# Patient Record
Sex: Female | Born: 1975 | Hispanic: Yes | Marital: Married | Smoking: Never smoker
Health system: Southern US, Community
[De-identification: ages and names within clinical notes are randomized; demographics above are authoritative.]

## PROBLEM LIST (undated history)

## (undated) DIAGNOSIS — R300 Dysuria: Secondary | ICD-10-CM

## (undated) DIAGNOSIS — R12 Heartburn: Secondary | ICD-10-CM

## (undated) HISTORY — DX: Dysuria: R30.0

## (undated) HISTORY — PX: CHOLECYSTECTOMY: SHX55

## (undated) HISTORY — DX: Heartburn: R12

## (undated) HISTORY — PX: TUBAL LIGATION: SHX77

---

## 2006-11-18 ENCOUNTER — Ambulatory Visit: Payer: Self-pay | Admitting: Psychiatry

## 2007-10-16 ENCOUNTER — Emergency Department: Payer: Self-pay | Admitting: Emergency Medicine

## 2008-03-20 ENCOUNTER — Ambulatory Visit: Payer: Self-pay | Admitting: Certified Nurse Midwife

## 2008-06-07 ENCOUNTER — Ambulatory Visit: Payer: Self-pay | Admitting: Certified Nurse Midwife

## 2008-07-30 ENCOUNTER — Inpatient Hospital Stay: Payer: Self-pay | Admitting: Obstetrics and Gynecology

## 2008-10-05 ENCOUNTER — Ambulatory Visit: Payer: Self-pay | Admitting: Certified Nurse Midwife

## 2010-08-20 ENCOUNTER — Ambulatory Visit: Payer: Self-pay | Admitting: Gastroenterology

## 2010-08-21 ENCOUNTER — Ambulatory Visit: Payer: Self-pay | Admitting: Gastroenterology

## 2010-09-12 ENCOUNTER — Ambulatory Visit: Payer: Self-pay | Admitting: Surgery

## 2010-09-19 ENCOUNTER — Ambulatory Visit: Payer: Self-pay | Admitting: Surgery

## 2010-09-22 LAB — PATHOLOGY REPORT

## 2012-10-07 IMAGING — NM NUCLEAR MEDICINE HEPATOHBILIARY INCLUDE GB
2 series · 12 of 12 positions shown · non-contrast
Comparison: none

REASON FOR EXAM: abd pain  epigastric dysphagia   elevated alk
phosphatase  nausea
COMMENTS:

[Series 1000: gallbladder dynamic (results) · 4.80mm/px · 6 of 60 frames shown]
[frame 6/60]
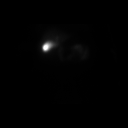
[frame 16/60]
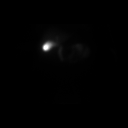
[frame 26/60]
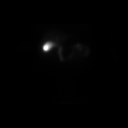
[frame 36/60]
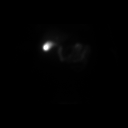
[frame 46/60]
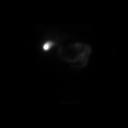
[frame 56/60]
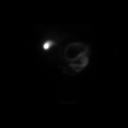

[Series 1000: gallbladder dynamic · 4.80mm/px · 6 of 60 frames shown]
[frame 6/60]
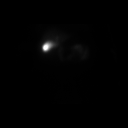
[frame 16/60]
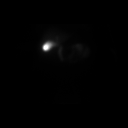
[frame 26/60]
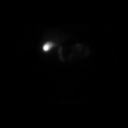
[frame 36/60]
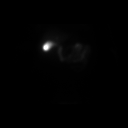
[frame 46/60]
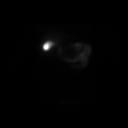
[frame 56/60]
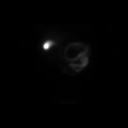

[12 of 12 positions shown; findings below may reference images not displayed]

PROCEDURE:     KNM - KNM HEPATO W/GB EJECT FRACTION  - August 20, 2010 [DATE]

RESULT:     Following intravenous administration of 7.99 mCi technetium 99m
Choletec, there is observed prompt visualization of tracer activity in the
liver at 3 minutes. At 65 minutes, tracer activity is visualized in the
gallbladder, common duct and proximal small bowel.

The gallbladder ejection fraction at 30 minutes following installation of
Sincalide drip measured 29% which is below the normal range for which the
lower limit is 35% at this institution.
IMPRESSION: 1.  Normal Hepatobiliary Scan.
2.  The gallbladder ejection fraction measures 29% which is below the normal
range.

## 2012-11-15 ENCOUNTER — Emergency Department: Payer: Self-pay | Admitting: Emergency Medicine

## 2012-11-15 LAB — URINALYSIS, COMPLETE
Ketone: NEGATIVE
Nitrite: POSITIVE
Ph: 7 (ref 4.5–8.0)
Protein: NEGATIVE
Specific Gravity: 1.001 (ref 1.003–1.030)
Squamous Epithelial: <1
WBC UR: 7 /HPF (ref 0–5)

## 2013-01-29 ENCOUNTER — Emergency Department: Payer: Self-pay | Admitting: Emergency Medicine

## 2013-01-29 LAB — CBC
HCT: 41.7 % (ref 35.0–47.0)
HGB: 13.9 g/dL (ref 12.0–16.0)
MCH: 27.4 pg (ref 26.0–34.0)
MCHC: 33.3 g/dL (ref 32.0–36.0)
Platelet: 219 10*3/uL (ref 150–440)
RDW: 13.4 % (ref 11.5–14.5)

## 2013-01-29 LAB — BASIC METABOLIC PANEL
Calcium, Total: 8.7 mg/dL (ref 8.5–10.1)
Chloride: 105 mmol/L (ref 98–107)
Co2: 29 mmol/L (ref 21–32)
Creatinine: 0.79 mg/dL (ref 0.60–1.30)
EGFR (African American): >60
EGFR (Non-African Amer.): >60
Glucose: 107 mg/dL — ABNORMAL HIGH (ref 65–99)
Potassium: 3.5 mmol/L (ref 3.5–5.1)

## 2013-01-29 LAB — URINALYSIS, COMPLETE
Bilirubin,UR: NEGATIVE
Glucose,UR: NEGATIVE mg/dL (ref 0–75)
Ketone: NEGATIVE
Nitrite: POSITIVE
Ph: 7 (ref 4.5–8.0)
Specific Gravity: 1.001 (ref 1.003–1.030)
WBC UR: 30 /HPF (ref 0–5)

## 2013-01-31 LAB — URINE CULTURE

## 2013-02-10 ENCOUNTER — Ambulatory Visit: Payer: Self-pay | Admitting: Family Medicine

## 2013-04-18 DIAGNOSIS — N390 Urinary tract infection, site not specified: Secondary | ICD-10-CM | POA: Insufficient documentation

## 2015-12-03 ENCOUNTER — Ambulatory Visit (INDEPENDENT_AMBULATORY_CARE_PROVIDER_SITE_OTHER): Payer: Managed Care, Other (non HMO) | Admitting: Urology

## 2015-12-03 ENCOUNTER — Encounter: Payer: Self-pay | Admitting: Urology

## 2015-12-03 VITALS — BP 149/85 | HR 78 | Ht 62.0 in | Wt 135.0 lb

## 2015-12-03 DIAGNOSIS — R109 Unspecified abdominal pain: Secondary | ICD-10-CM | POA: Diagnosis not present

## 2015-12-03 DIAGNOSIS — R300 Dysuria: Secondary | ICD-10-CM | POA: Diagnosis not present

## 2015-12-03 DIAGNOSIS — R32 Unspecified urinary incontinence: Secondary | ICD-10-CM

## 2015-12-03 LAB — URINALYSIS, COMPLETE
Bilirubin, UA: NEGATIVE
Glucose, UA: NEGATIVE
Ketones, UA: NEGATIVE
LEUKOCYTES UA: NEGATIVE
Nitrite, UA: NEGATIVE
PH UA: 5.5 (ref 5.0–7.5)
PROTEIN UA: NEGATIVE
Urobilinogen, Ur: 0.2 mg/dL (ref 0.2–1.0)

## 2015-12-03 LAB — BLADDER SCAN AMB NON-IMAGING: SCAN RESULT: 86

## 2015-12-03 LAB — MICROSCOPIC EXAMINATION

## 2015-12-03 NOTE — Progress Notes (Signed)
12/03/2015 10:40 AM   [place] Nov 23, 1975 [id number]  Referring provider: No referring provider defined for this encounter.  Chief Complaint  Patient presents with  . Dysuria    referred by [facility]    HPI: Patient is a 40 year old Hispanic female who is referred to us by the [facility] for [facility].  She presents with interpreter, Caesar.  He shouldn't states that over the last 20 years she has been experiencing pain after urination and stress urinary incontinence. She states when she holds her urine for too long it becomes painful.  She denies any gross hematuria. UA is unremarkable. Her PVR is 86 mL.  She describes a procedure that she underwent 16 years ago which sounds like a hydro-distention with a physician in [place].  She does not recollect the outcome of that procedure. More recently, she was evaluated by [facility] Raoul [facility]Borawski [facility] in [place].  She underwent a CT scan of the abdomen and pelvis without contrast. This was performed on 03/14/2013. No nephrolithiasis or ureterolithiasis was seen. No evidence of hydronephrosis.  She also states that they looked inside her bladder. She does not recall the findings of that evaluation.  She has been given antibiotics for urinary tract infections, but I do not see any documented urinary tract infections.  She states that she has intermittent flank pain.  It is not intense. It does not localize to one side.  It does not radiate.   PMH: Past Medical History  Diagnosis Date  . Dysuria   . Heartburn     Surgical History: Past Surgical History  Procedure Laterality Date  . Tubal ligation    . Cholecystectomy      Home Medications:    Medication List       This list is accurate as of: 12/03/15 10:40 AM.  Always use your most recent med list.               sulfamethoxazole-trimethoprim 800-160 MG tablet  Commonly known as:  BACTRIM DS,SEPTRA DS  Reported on 12/03/2015        Allergies: No  Known Allergies  Family History: Family History  Problem Relation Age of Onset  . Kidney disease Neg Hx   . Bladder Cancer Neg Hx     Social History:  reports that she has never smoked. She does not have any smokeless tobacco history on file. She reports that she does not drink alcohol or use illicit drugs.  ROS: UROLOGY Frequent Urination?: No Hard to postpone urination?: No Burning/pain with urination?: Yes Get up at night to urinate?: No Leakage of urine?: Yes Urine stream starts and stops?: No Trouble starting stream?: No Do you have to strain to urinate?: No Blood in urine?: No Urinary tract infection?: No Sexually transmitted disease?: No Injury to kidneys or bladder?: No Painful intercourse?: No Weak stream?: No Currently pregnant?: No Vaginal bleeding?: No Last menstrual period?: n  Gastrointestinal Nausea?: No Vomiting?: No Indigestion/heartburn?: No Diarrhea?: No Constipation?: No  Constitutional Fever: No Night sweats?: No Weight loss?: Yes Fatigue?: No  Skin Skin rash/lesions?: No Itching?: No  Eyes Blurred vision?: No Double vision?: No  Ears/Nose/Throat Sore throat?: No Sinus problems?: No  Hematologic/Lymphatic Swollen glands?: No Easy bruising?: No  Cardiovascular Leg swelling?: No Chest pain?: No  Respiratory Cough?: No Shortness of breath?: No  Endocrine Excessive thirst?: No  Musculoskeletal Back pain?: No Joint pain?: Yes  Neurological Headaches?: No Dizziness?: No  Psychologic Depression?: No Anxiety?:  No  Physical Exam: BP 149/85 mmHg  Pulse 78  Ht 5\' 2"  (1.575 m)  Wt 135 lb (61.236 kg)  BMI 24.69 kg/m2  LMP 11/28/2015  Constitutional: Well nourished. Alert and oriented, No acute distress. HEENT: [place] AT, moist mucus membranes. Trachea midline, no masses. Cardiovascular: No clubbing, cyanosis, or edema. Respiratory: Normal respiratory effort, no increased work of breathing. GI: Abdomen is soft, non  tender, non distended, no abdominal masses. Liver and spleen not palpable.  No hernias appreciated.  Stool sample for occult testing is not indicated.   GU: No CVA tenderness.  No bladder fullness or masses.  Normal external genitalia, normal pubic hair distribution, no lesions.  Normal urethral meatus, no lesions, no prolapse, no discharge.   No urethral masses, tenderness and/or tenderness. No bladder fullness, tenderness or masses. Normal vagina mucosa, good estrogen effect, no discharge, no lesions, good pelvic support, Grade II cystocele is noted.  Rectocele is not noted.  No cervical motion tenderness.  Uterus is freely mobile and non-fixed.  No adnexal/parametria masses or tenderness noted.  Anus and perineum are without rashes or lesions.    Skin: No rashes, bruises or suspicious lesions. Lymph: No cervical or inguinal adenopathy. Neurologic: Grossly intact, no focal deficits, moving all 4 extremities. Psychiatric: Normal mood and affect.  Laboratory Data: Lab Results  Component Value Date   WBC 8.5 01/29/2013   HGB 13.9 01/29/2013   HCT 41.7 01/29/2013   MCV 82 01/29/2013   PLT 219 01/29/2013    Lab Results  Component Value Date   CREATININE 0.79 01/29/2013     Urinalysis Results for orders placed or performed in visit on 12/03/15  CULTURE, URINE COMPREHENSIVE  Result Value Ref Range   Urine Culture, Comprehensive Final report    Result 1 Lactobacillus species    Result 2 Comment   Microscopic Examination  Result Value Ref Range   WBC, UA 0-5 0 -  5 /hpf   RBC, UA 0-2 0 -  2 /hpf   Epithelial Cells (non renal) 0-10 0 - 10 /hpf   Bacteria, UA Few (A) None seen/Few  Urinalysis, Complete  Result Value Ref Range   Specific Gravity, UA <1.005 (L) 1.005 - 1.030   pH, UA 5.5 5.0 - 7.5   Color, UA Yellow Yellow   Appearance Ur Clear Clear   Leukocytes, UA Negative Negative   Protein, UA Negative Negative/Trace   Glucose, UA Negative Negative   Ketones, UA Negative  Negative   RBC, UA Trace (A) Negative   Bilirubin, UA Negative Negative   Urobilinogen, Ur 0.2 0.2 - 1.0 mg/dL   Nitrite, UA Negative Negative   Microscopic Examination See below:   BLADDER SCAN AMB NON-IMAGING  Result Value Ref Range   Scan Result 86     Pertinent Imaging: Results for TORIA, MONTE (MRN [id number]) as of 12/03/2015 10:32  Ref. Range 12/03/2015 10:22  Scan Result Unknown 86   CT Abdomen Pelvis Wo Contrast - Final result (03/14/2013 9:46 AM) CT Abdomen Pelvis Wo Contrast - Final result (03/14/2013 9:46 AM)  Narrative  [id number] UN 03/14/1408:[id number] [facility]) : CT ABDOMEN PELVIS WO CONTRAST     EXAM: Computed tomography, abdomen and pelvis without contrast material.    DICTATED: 03/14/13 09:49:07    CLINICAL INDICATION: 40 year old F.599.0 - evaluation for renal calculi.    COMPARISON: None     TECHNIQUE:Contiguous axial 5-mm images from the lung bases through the proximal femora were obtained without the administration of oral or  intravenous contrast agents.  For all [facility] CT exams, radiation dose reduction device (automated exposure control) is used or manual techniques with radiation dose As Low As Reasonably Achievable (ALARA) protocol are followed using age and patient-size-specific scan parameters, while   maintaining the necessary diagnostic image quality.    FINDINGS:   The visualized lungs appear unremarkable.    Evaluation of the abdominal viscera is limited due to the lack of intravenous contrast.  The gallbladder is surgically absent. The liver, pancreas, spleen and adrenal glands appear unremarkable.    No nephrolithiasis is identified in either kidney. There is no hydronephrosis, hydroureter, or stones seen along the course of either ureter.No perinephric stranding is identified about either kidney.    There is trace free fluid within the pelvis.The bladder is distended.  There is no evidence of bowel  obstruction.The appendix is normal.    No pathologically enlarged abdominal or pelvic lymph nodes are seen.    The visualized osseous structures are grossly unremarkable.    INTERPRETATION LOCATION:[facility]    IMPRESSION:  No nephrolithiasis or ureterolithiasis seen. No evidence of hydronephrosis.     Assessment & Plan:    1. Dysuria:   Patient describes a possible interstitial cystitis scenario.  This is a diagnosis of exclusion, so we will need to pursue testing to rule out other etiologies.  At this time, she will be undergoing a CT renal stone study to evaluate for possible nephrolithiasis causing her symptoms.  Her UA is unremarkable today, but I will send it for culture for complete workup.  We'll also request the records from Dr. [name] office  - Urinalysis, Complete - CULTURE, URINE COMPREHENSIVE  2. Incontinence:   Patient is describing a mild stress urinary incontinence. I will initiate her on Vesicare 5 mg daily.   I have advised her of the side effects of Vesicare, such as: Dry eyes, dry mouth, constipation, mental confusion and/or urinary retention.  She will be returning for her CT scan results and we will evaluate her symptoms at that time.  - BLADDER SCAN AMB NON-IMAGING  3. Flank pain:   Patient will be undergoing a CT renal stone study in the future for further evaluation. She will return for report. She will contact the office with flank pain becomes intractable or she develops gross hematuria.  Return for CT Renal stone report.  These notes generated with voice recognition software. I apologize for typographical errors.  Michiel Cowboy, PA-C  [facility] [address] [place], [place] [address] [phone]

## 2015-12-06 LAB — CULTURE, URINE COMPREHENSIVE

## 2015-12-08 ENCOUNTER — Telehealth: Payer: Self-pay | Admitting: Urology

## 2015-12-08 DIAGNOSIS — R109 Unspecified abdominal pain: Secondary | ICD-10-CM | POA: Insufficient documentation

## 2015-12-08 DIAGNOSIS — R300 Dysuria: Secondary | ICD-10-CM | POA: Insufficient documentation

## 2015-12-08 DIAGNOSIS — R32 Unspecified urinary incontinence: Secondary | ICD-10-CM | POA: Insufficient documentation

## 2015-12-08 NOTE — Telephone Encounter (Signed)
Which use and my note to the [facility]?

## 2015-12-10 NOTE — Telephone Encounter (Signed)
Would you send my notes?

## 2015-12-10 NOTE — Telephone Encounter (Signed)
What am I supposed to do with the notes?

## 2015-12-11 ENCOUNTER — Telehealth: Payer: Self-pay | Admitting: Urology

## 2015-12-11 NOTE — Telephone Encounter (Signed)
I sent your notes to her PCP  [place]

## 2015-12-17 ENCOUNTER — Ambulatory Visit: Payer: PRIVATE HEALTH INSURANCE | Admitting: Urology

## 2015-12-20 ENCOUNTER — Ambulatory Visit
Admission: RE | Admit: 2015-12-20 | Discharge: 2015-12-20 | Disposition: A | Discharge status: Home / Self Care | Payer: Managed Care, Other (non HMO) | Source: Ambulatory Visit | Attending: Urology | Admitting: Urology

## 2015-12-20 DIAGNOSIS — R109 Unspecified abdominal pain: Secondary | ICD-10-CM | POA: Insufficient documentation

## 2015-12-25 ENCOUNTER — Encounter: Payer: Self-pay | Admitting: Urology

## 2015-12-25 ENCOUNTER — Ambulatory Visit (INDEPENDENT_AMBULATORY_CARE_PROVIDER_SITE_OTHER): Payer: Managed Care, Other (non HMO) | Admitting: Urology

## 2015-12-25 VITALS — BP 145/81 | HR 75 | Ht 61.0 in | Wt 137.0 lb

## 2015-12-25 DIAGNOSIS — R300 Dysuria: Secondary | ICD-10-CM

## 2015-12-25 DIAGNOSIS — R109 Unspecified abdominal pain: Secondary | ICD-10-CM

## 2015-12-25 DIAGNOSIS — R32 Unspecified urinary incontinence: Secondary | ICD-10-CM

## 2015-12-25 LAB — BLADDER SCAN AMB NON-IMAGING: Scan Result: 120

## 2015-12-25 NOTE — Progress Notes (Signed)
10:21 AM   Pat[place] 10-21-1975 [id number]  Referring provider: [facility] [address] [place], [place] [address]  Chief Complaint  Patient presents with  . Follow-up    CT scan results     HPI: Patient is a 40-year-old Hispanic female who presents today with interpreter, [place], to discuss CT renal stone study results.  Background history Patient was referred to [place] by the [place] [facility] for dysuria.  She stated that over the last 20 years she has been experiencing pain after urination and stress urinary incontinence.  She states when she holds her urine for too long it becomes painful.  She denies any gross hematuria. UA was unremarkable. Her PVR was 86 mL.  She describes a procedure that she underwent 16 years ago which sounds like a hydro-distention with a physician in [place].  She does not recollect the outcome of that procedure. More recently, she was evaluated by Dr. Raoul [place] [facility] in [place].  She underwent a CT scan of the abdomen and pelvis without contrast. This was performed on 03/14/2013. No nephrolithiasis or ureterolithiasis was seen. No evidence of hydronephrosis.  She also states that they looked inside her bladder. She does not recall the findings of that evaluation.  She has been given antibiotics for urinary tract infections, but I do not see any documented urinary tract infections.  She states that she has intermittent flank pain.  It is not intense. It does not localize to one side.  It does not radiate.  Today, she still complains of painful urination.  She did complete the Vesicare samples I have given her, but she did not engage in any exercise.  She only has the complaint of mild stress urinary incontinence.  She is also still experiencing the bilateral flank pain. She has not had any gross hematuria.  Her PVR today's visit is 120 mL.  CT renal stone study completed on 12/20/2015 noted no acute findings  identified within the abdomen or pelvis. No explanation for flank pain or urinary symptoms.  I personally reviewed the CT films with the patient and interpreter.  Her urine culture from last visit was negative.  Records received from [place] office indicate that patient has been treated with antibiotics and told that she's had urinary tract infections without positive urine cultures.  This is very frustrating to the patient as there has been no other etiology found for her dysuria.  It has also caused her to be skeptical about what she has been told.   PMH: Past Medical History  Diagnosis Date  . Dysuria   . Heartburn     Surgical History: Past Surgical History  Procedure Laterality Date  . Tubal ligation    . Cholecystectomy      Home Medications:    Medication List    Notice  As of 12/25/2015 10:21 AM   You have not been prescribed any medications.      Allergies: No Known Allergies  Family History: Family History  Problem Relation Age of Onset  . Kidney disease Neg Hx   . Bladder Cancer Neg Hx     Social History:  reports that she has never smoked. She does not have any smokeless tobacco history on file. She reports that she does not drink alcohol or use illicit drugs.  ROS: UROLOGY Frequent Urination?: No Hard to postpone urination?: No Burning/pain with urination?: Yes Get [place] night to urinate?: No Leakage  of urine?: No Urine stream starts and stops?: No Trouble starting stream?: No Do you have to strain to urinate?: No Blood in urine?: No Urinary tract infection?: No Sexually transmitted disease?: No Injury to kidneys or bladder?: No Painful intercourse?: Yes Weak stream?: No Currently pregnant?: No Vaginal bleeding?: No Last menstrual period?: No  Gastrointestinal Nausea?: No Vomiting?: No Indigestion/heartburn?: Yes Diarrhea?: No Constipation?: No  Constitutional Fever: No Night sweats?: No Weight loss?: No Fatigue?:  No  Skin Skin rash/lesions?: No Itching?: No  Eyes Blurred vision?: No Double vision?: No  Ears/Nose/Throat Sore throat?: No Sinus problems?: No  Hematologic/Lymphatic Swollen glands?: No Easy bruising?: Yes  Cardiovascular Leg swelling?: No Chest pain?: No  Respiratory Cough?: No Shortness of breath?: No  Endocrine Excessive thirst?: No  Musculoskeletal Back pain?: Yes Joint pain?: No  Neurological Headaches?: Yes Dizziness?: No  Psychologic Depression?: No Anxiety?: No  Physical Exam: BP 145/81 mmHg  Pulse 75  Ht 5\' 1"  (1.549 m)  Wt 137 lb (62.143 kg)  BMI 25.90 kg/m2  LMP 11/28/2015  Constitutional: Well nourished. Alert and oriented, No acute distress. HEENT: [place], moist mucus membranes. Trachea midline, no masses. Cardiovascular: No clubbing, cyanosis, or edema. Respiratory: Normal respiratory effort, no increased work of breathing. GI: Abdomen is soft, non tender, non distended, no abdominal masses.  GU: No CVA tenderness.  No bladder fullness or masses.   Skin: No rashes, bruises or suspicious lesions. Lymph: No cervical or inguinal adenopathy. Neurologic: Grossly intact, no focal deficits, moving all 4 extremities. Psychiatric: Normal mood and affect.  Laboratory Data: Lab Results  Component Value Date   WBC 8.5 01/29/2013   HGB 13.9 01/29/2013   HCT 41.7 01/29/2013   MCV 82 01/29/2013   PLT 219 01/29/2013    Lab Results  Component Value Date   CREATININE 0.79 01/29/2013    Pertinent Imaging: Results for [place] (MRN [id number]) as of 12/29/2015 13:21  Ref. Range 12/25/2015 09:57  Scan Result Unknown 120    CLINICAL DATA: Cholecystectomy. Complains of left flank pain for 1 year. Difficulty with urination.  EXAM: CT ABDOMEN AND PELVIS WITHOUT CONTRAST  TECHNIQUE: Multidetector CT imaging of the abdomen and pelvis was performed following the standard protocol without IV contrast.  COMPARISON:  None.  FINDINGS: Lower chest: The lung bases are clear. No pleural or pericardial effusion noted.  Hepatobiliary: No mass visualized on this un-enhanced exam. Previous cholecystectomy.  Pancreas: No mass or inflammatory process identified on this un-enhanced exam.  Spleen: Within normal limits in size.  Adrenals/Urinary Tract: The adrenal glands appear normal. No kidney stones or hydronephrosis identified. No ureteral lithiasis. The urinary bladder appears normal.  Stomach/Bowel: The stomach is within normal limits. The small bowel loops have a normal course and caliber. No obstruction. Normal appearance of the colon.  Vascular/Lymphatic: Normal appearance of the abdominal aorta. No enlarged retroperitoneal or mesenteric adenopathy. No enlarged pelvic or inguinal lymph nodes.  Reproductive: The uterus and the adnexal structures are unremarkable.  Other: There is no ascites or focal fluid collections within the abdomen or pelvis.  Musculoskeletal: No suspicious bone lesions identified.  IMPRESSION: 1. No acute findings identified within the abdomen or pelvis. 2. No explanation for left flank pain an urinary symptoms.   Electronically Signed  By: Signa Kell M.D.  On: 12/20/2015 10:18              Assessment & Plan:    1. Dysuria:   Patient describes a possible interstitial cystitis scenario.  This is a diagnosis of exclusion, so we will need to pursue testing to rule out other etiologies.   Urine culture was negative from her last visit.  CT renal study did not demonstrate any etiology for her urinary symptoms.  I've recommended a cystoscopy. I described how this is performed, typically in an office setting with a flexible cystoscope. I described the risks, benefits, and possible side effects, the most common of which is a minor amount of blood in the urine and/or burning which usually resolves in 24 to 48 hours. The patient had the  opportunity to ask questions which were answered. Based upon this discussion, the patient is willing to proceed.   2. Incontinence:   Patient is describing a mild stress urinary incontinence. I will initiate her on Vesicare 5 mg daily.   I have advised her of the side effects of Vesicare, such as: Dry eyes, dry mouth, constipation, mental confusion and/or urinary retention.  Patient did not engage in any activities that caused her incontinence, so it is unclear if the Vesicare 5 mg samples were effective.   We will readdress after she has undergone cystoscopic examination.  3. Flank pain:   CT renal stone study did not find an etiology for her flank pain.     Return for cystoscopy for dysuria and negative urine cultures.  These notes generated with voice recognition software. I apologize for typographical errors.  Michiel CowboySHANNON Jamee Pacholski, PA-C  [facility] [address], [address] [place], [place] [address] [phone]

## 2015-12-29 ENCOUNTER — Telehealth: Payer: Self-pay | Admitting: Urology

## 2015-12-29 NOTE — Telephone Encounter (Signed)
Would you please send a copy of this office visit to the patient's referring provider, [facility]?

## 2015-12-30 NOTE — Telephone Encounter (Signed)
done

## 2016-01-31 ENCOUNTER — Ambulatory Visit (INDEPENDENT_AMBULATORY_CARE_PROVIDER_SITE_OTHER): Payer: Managed Care, Other (non HMO) | Admitting: Urology

## 2016-01-31 ENCOUNTER — Encounter: Payer: Self-pay | Admitting: Urology

## 2016-01-31 VITALS — BP 159/91 | HR 80 | Ht 61.0 in | Wt 134.6 lb

## 2016-01-31 DIAGNOSIS — N3010 Interstitial cystitis (chronic) without hematuria: Secondary | ICD-10-CM

## 2016-01-31 DIAGNOSIS — R300 Dysuria: Secondary | ICD-10-CM | POA: Diagnosis not present

## 2016-01-31 LAB — URINALYSIS, COMPLETE
BILIRUBIN UA: NEGATIVE
Glucose, UA: NEGATIVE
Ketones, UA: NEGATIVE
Nitrite, UA: POSITIVE — AB
PH UA: 7 (ref 5.0–7.5)
PROTEIN UA: NEGATIVE
RBC, UA: NEGATIVE
Specific Gravity, UA: 1.015 (ref 1.005–1.030)
Urobilinogen, Ur: 0.2 mg/dL (ref 0.2–1.0)

## 2016-01-31 LAB — MICROSCOPIC EXAMINATION
Epithelial Cells (non renal): >10 /hpf — AB (ref 0–10)
RBC, UA: NONE SEEN /hpf (ref 0–?)

## 2016-01-31 MED ORDER — CIPROFLOXACIN HCL 500 MG PO TABS
500.0000 mg | ORAL_TABLET | Freq: Once | ORAL | Status: AC
  Administered 2016-01-31: 500 mg via ORAL

## 2016-01-31 MED ORDER — URIBEL 118 MG PO CAPS: 1.0000 | ORAL_CAPSULE | Freq: Four times a day (QID) | ORAL | Status: AC | PRN

## 2016-01-31 MED ORDER — LIDOCAINE HCL 2 % EX GEL
1.0000 "application " | Freq: Once | CUTANEOUS | Status: AC
  Administered 2016-01-31: 1 via URETHRAL

## 2016-01-31 NOTE — Progress Notes (Signed)
8:31 PM  01/31/16  Patricia Douglas Oct 10, 1975 [id number]  Referring provider: Phineas Realharles Drew [facility] [address] [place], [place] [address]  Chief Complaint  Patient presents with  . Cysto    HPI: 40 yo F with chronic dysuria, pelvic pain in 20 years s/p work up Including CT stone study on 12/20/2015 for flank pain which was negative.  For the most part, she has a borderline appearing UA but ultimately negative urine cultures. She presents for office cystoscopy today to rule out any anatomic cause of her chronic intermittent dysuria.  She had several other workups in the past including possibly a hydrodistention. She does not ever recall being personally diagnosed with interstitial cystitis.  Her primary complaints today are burning with urination and pelvic pain.  Over-the-counter Pyridium does not seem to help. She is also when tried on Vesicare for history of incontinence which did not improve her symptoms.  She does report today that eating spicy foods such as chilies certainly triggers her episodes of pain. She tries to drink plenty of water and when she has had, she also has increased pain.  She denies any gross hematuria.  She is accompanied today by a Engineer, structuralpanish translator.  PMH: Past Medical History  Diagnosis Date  . Dysuria   . Heartburn     Surgical History: Past Surgical History  Procedure Laterality Date  . Tubal ligation    . Cholecystectomy      Home Medications:    Medication List       This list is accurate as of: 01/31/16  8:31 PM.  Always use your most recent med list.               URIBEL 118 MG Caps  Take 1 capsule (118 mg total) by mouth 4 (four) times daily as needed.        Allergies: No Known Allergies  Family History: Family History  Problem Relation Age of Onset  . Kidney disease Neg Hx   . Bladder Cancer Neg Hx     Social History:  reports that she has never smoked. She does not have any  smokeless tobacco history on file. She reports that she does not drink alcohol or use illicit drugs.   Physical Exam: BP 159/91 mmHg  Pulse 80  Ht 5\' 1"  (1.549 m)  Wt 134 lb 9.6 oz (61.054 kg)  BMI 25.45 kg/m2  LMP 01/17/2016  Constitutional: Well nourished. Alert and oriented, No acute distress. HEENT: [place] AT, moist mucus membranes. Trachea midline, no masses. Cardiovascular: No clubbing, cyanosis, or edema. Respiratory: Normal respiratory effort, no increased work of breathing. GI: Abdomen is soft, non tender, non distended, no abdominal masses.  GU: No CVA tenderness.  No bladder fullness or masses.    Normal urethral meatus. Skin: No rashes, bruises or suspicious lesions. Neurologic: Grossly intact, no focal deficits, moving all 4 extremities. Psychiatric: Normal mood and affect.  Laboratory Data: Lab Results  Component Value Date   WBC 8.5 01/29/2013   HGB 13.9 01/29/2013   HCT 41.7 01/29/2013   MCV 82 01/29/2013   PLT 219 01/29/2013    Lab Results  Component Value Date   CREATININE 0.79 01/29/2013    Pertinent Imaging:  CLINICAL DATA: Cholecystectomy. Complains of left flank pain for 1 year. Difficulty with urination.  EXAM: CT ABDOMEN AND PELVIS WITHOUT CONTRAST  TECHNIQUE: Multidetector CT imaging of the abdomen and pelvis was performed following the standard protocol without IV  contrast.  COMPARISON: None.  FINDINGS: Lower chest: The lung bases are clear. No pleural or pericardial effusion noted.  Hepatobiliary: No mass visualized on this un-enhanced exam. Previous cholecystectomy.  Pancreas: No mass or inflammatory process identified on this un-enhanced exam.  Spleen: Within normal limits in size.  Adrenals/Urinary Tract: The adrenal glands appear normal. No kidney stones or hydronephrosis identified. No ureteral lithiasis. The urinary bladder appears normal.  Stomach/Bowel: The stomach is within normal limits. The small  bowel loops have a normal course and caliber. No obstruction. Normal appearance of the colon.  Vascular/Lymphatic: Normal appearance of the abdominal aorta. No enlarged retroperitoneal or mesenteric adenopathy. No enlarged pelvic or inguinal lymph nodes.  Reproductive: The uterus and the adnexal structures are unremarkable.  Other: There is no ascites or focal fluid collections within the abdomen or pelvis.  Musculoskeletal: No suspicious bone lesions identified.  IMPRESSION: 1. No acute findings identified within the abdomen or pelvis. 2. No explanation for left flank pain an urinary symptoms.   Electronically Signed  By: Signa Kellaylor Stroud M.D.  On: 12/20/2015 10:18            Cystoscopy Procedure Note  Patient identification was confirmed, informed consent was obtained, and patient was prepped using Betadine solution.  Lidocaine jelly was administered per urethral meatus.    Preoperative abx where received prior to procedure.    Procedure: - Flexible cystoscope introduced, without any difficulty.   - Thorough search of the bladder revealed:    normal urethral meatus    normal urothelium    no stones    no ulcers     no tumors    no urethral polyps    no trabeculation  - Ureteral orifices were normal in position and appearance.  Post-Procedure: - Patient tolerated the procedure well   Assessment & Plan:    1. Dysuria Cystoscopy today unremarkable. No clear anatomic cause for dysuria. Given her history and chronicity of symptoms, I suspect interstitial cystitis. See below. - Urinalysis, Complete - ciprofloxacin (CIPRO) tablet 500 mg; Take 1 tablet (500 mg total) by mouth once. - lidocaine (XYLOCAINE) 2 % jelly 1 application; Place 1 application into the urethra once. - Meth-Hyo-M Bl-Na Phos-Ph Sal (URIBEL) 118 MG CAPS; Take 1 capsule (118 mg total) by mouth 4 (four) times daily as needed.  Dispense: 120 capsule; Refill: 6  2. IC (interstitial  cystitis) We discussed the diease pathophysiology is poorly understood therefore treatment has been focused primarily on symptomatic relief as well as dietary and behavioral modification. Information pamphlets were reviewed and given today discussing the current understanding of the syndrome as well as treatment options. IC dietary information also given today as many patients experience relief with simple lifestyle modifications. We also discussed intermittent use of over the counter pyridium (no greater than 3 days at at time) for intermittent relief or Uribel or Urogesic  blue which can be taken chronically. I have given her samples of this today as well as a prescription as needed.  She had innumerable questions today and conversation was lengthy. I have recommended that she try the elimination diet and return in 3 months to continue to discuss. If no improvement, may consider more aggressive therapy such as [place]meron.  - Meth-Hyo-M Bl-Na Phos-Ph Sal (URIBEL) 118 MG CAPS; Take 1 capsule (118 mg total) by mouth 4 (four) times daily as needed.  Dispense: 120 capsule; Refill: 6   Return in about 3 months (around 05/02/2016) for shannon to discuss IC.  These notes  generated with voice recognition software. I apologize for typographical errors.  Vanna Scotland, MD  [facility] [address] [place], [place] [address] [phone]

## 2016-05-01 ENCOUNTER — Ambulatory Visit: Payer: Managed Care, Other (non HMO) | Admitting: Urology

## 2016-05-18 ENCOUNTER — Ambulatory Visit: Payer: Managed Care, Other (non HMO) | Admitting: Urology

## 2017-08-31 ENCOUNTER — Emergency Department
Admission: EM | Admit: 2017-08-31 | Discharge: 2017-08-31 | Disposition: A | Discharge status: Home / Self Care | Payer: Worker's Compensation | Attending: Emergency Medicine | Admitting: Emergency Medicine

## 2017-08-31 ENCOUNTER — Other Ambulatory Visit: Payer: Self-pay

## 2017-08-31 ENCOUNTER — Encounter: Payer: Self-pay | Admitting: Emergency Medicine

## 2017-08-31 ENCOUNTER — Emergency Department: Payer: Worker's Compensation

## 2017-08-31 DIAGNOSIS — Y990 Civilian activity done for income or pay: Secondary | ICD-10-CM | POA: Insufficient documentation

## 2017-08-31 DIAGNOSIS — W231XXA Caught, crushed, jammed, or pinched between stationary objects, initial encounter: Secondary | ICD-10-CM | POA: Insufficient documentation

## 2017-08-31 DIAGNOSIS — S61112A Laceration without foreign body of left thumb with damage to nail, initial encounter: Secondary | ICD-10-CM | POA: Diagnosis not present

## 2017-08-31 DIAGNOSIS — Y9389 Activity, other specified: Secondary | ICD-10-CM | POA: Diagnosis not present

## 2017-08-31 DIAGNOSIS — S6992XA Unspecified injury of left wrist, hand and finger(s), initial encounter: Secondary | ICD-10-CM | POA: Diagnosis present

## 2017-08-31 DIAGNOSIS — S62525B Nondisplaced fracture of distal phalanx of left thumb, initial encounter for open fracture: Secondary | ICD-10-CM | POA: Diagnosis not present

## 2017-08-31 DIAGNOSIS — Y929 Unspecified place or not applicable: Secondary | ICD-10-CM | POA: Insufficient documentation

## 2017-08-31 DIAGNOSIS — Z9049 Acquired absence of other specified parts of digestive tract: Secondary | ICD-10-CM | POA: Insufficient documentation

## 2017-08-31 MED ORDER — SULFAMETHOXAZOLE-TRIMETHOPRIM 800-160 MG PO TABS: 1.0000 | ORAL_TABLET | Freq: Two times a day (BID) | ORAL | 0 refills | Status: AC

## 2017-08-31 MED ORDER — CEPHALEXIN 500 MG PO CAPS: 500.0000 mg | ORAL_CAPSULE | Freq: Two times a day (BID) | ORAL | 0 refills | Status: AC

## 2017-08-31 MED ORDER — OXYCODONE HCL 5 MG PO TABS: 5.0000 mg | ORAL_TABLET | Freq: Three times a day (TID) | ORAL | 0 refills | Status: AC | PRN

## 2017-08-31 MED ORDER — LIDOCAINE HCL (PF) 1 % IJ SOLN
INTRAMUSCULAR | Status: AC
  Filled 2017-08-31: qty 5

## 2017-08-31 MED ORDER — BUPIVACAINE HCL (PF) 0.5 % IJ SOLN
INTRAMUSCULAR | Status: AC
  Filled 2017-08-31: qty 30

## 2017-08-31 NOTE — ED Triage Notes (Signed)
Presents with laceration to thumb at work

## 2017-08-31 NOTE — ED Notes (Signed)
Workers comp profile printed and placed in paper chart. Per profile UDS is only required upon request. Attempted to call patients supervisor Russella DarJeff Main [phone], no answer.

## 2017-09-01 NOTE — ED Provider Notes (Signed)
[facility] Emergency Department Provider Note  ____________________________________________  Time seen: Approximately 1906 I have reviewed the triage vital signs and the nursing notes.   HISTORY  Chief Complaint Laceration   HPI Patricia Douglas is a 42 y.o. female who presents to the emergency department for treatment and evaluation after injuring her left thumb while at work tonight.  Her thumb was caught in a machine and has torn away the nail.  Bandage was applied prior to arrival, otherwise no alleviating wound care has been completed.  Her tetanus vaccination is up-to-date.  Past Medical History:  Diagnosis Date  . Dysuria   . Heartburn     Patient Active Problem List   Diagnosis Date Noted  . Dysuria 12/08/2015  . Flank pain 12/08/2015  . Incontinence 12/08/2015  . Lower urinary tract infection 04/18/2013    Past Surgical History:  Procedure Laterality Date  . CHOLECYSTECTOMY    . TUBAL LIGATION      Prior to Admission medications   Medication Sig Start Date End Date Taking? Authorizing Provider  cephALEXin (KEFLEX) 500 MG capsule Take 1 capsule (500 mg total) by mouth 2 (two) times daily for 10 days. 08/31/17 09/10/17  Bradrick Kamau, Kasandra Knudsen, FNP  Meth-Hyo-M Bl-Na Phos-Ph Sal (URIBEL) 118 MG CAPS Take 1 capsule (118 mg total) by mouth 4 (four) times daily as needed. 01/31/16   Vanna Scotland, MD  oxyCODONE (ROXICODONE) 5 MG immediate release tablet Take 1 tablet (5 mg total) by mouth every 8 (eight) hours as needed. 08/31/17 08/31/18  Shyler Hamill, Rulon Eisenmenger B, FNP  sulfamethoxazole-trimethoprim (BACTRIM DS,SEPTRA DS) 800-160 MG tablet Take 1 tablet by mouth 2 (two) times daily. 08/31/17   Chinita Pester, FNP    Allergies Patient has no known allergies.  Family History  Problem Relation Age of Onset  . Kidney disease Neg Hx   . Bladder Cancer Neg Hx     Social History Social History   Tobacco Use  . Smoking status: Never Smoker  . Smokeless tobacco:  Never Used  Substance Use Topics  . Alcohol use: No    Alcohol/week: 0.0 oz  . Drug use: No    Review of Systems  Constitutional: Negative for fever. Respiratory: Negative for cough or shortness of breath.  Musculoskeletal: Positive for myalgias Skin: Positive for laceration to the left thumb. Neurological: Negative for numbness or paresthesias. ____________________________________________   PHYSICAL EXAM:  VITAL SIGNS: ED Triage Vitals  Enc Vitals Group     BP 08/31/17 1920 (!) 160/96     Pulse Rate 08/31/17 1920 81     Resp 08/31/17 1920 15     Temp 08/31/17 1920 98.5 F (36.9 C)     Temp Source 08/31/17 1920 Oral     SpO2 08/31/17 1920 100 %     Weight --      Height --      Head Circumference --      Peak Flow --      Pain Score 08/31/17 2043 0     Pain Loc --      Pain Edu? --      Excl. in GC? --      Constitutional: Uncomfortable appearing. Eyes: Conjunctivae are clear without discharge or drainage. Nose: No rhinorrhea noted. Mouth/Throat: Airway is patent.  Neck: No stridor. Unrestricted range of motion observed.  Cardiovascular: Capillary refill is <3 seconds.  Respiratory: Respirations are even and unlabored.. Musculoskeletal: Unrestricted range of motion observed. Neurologic: Awake, alert, and oriented x  4.  Skin: Positive for laceration.  ____________________________________________   LABS (all labs ordered are listed, but only abnormal results are displayed)  Labs Reviewed - No data to display ____________________________________________  EKG  Not indicated ____________________________________________  RADIOLOGY  Image of the left thumb shows a comminuted nondisplaced fracture of the distal phalanx of the thumb with laceration.  There is no radiopaque foreign body identified. ____________________________________________   PROCEDURES  .Marland Kitchen.Laceration Repair Date/Time: 08/31/2017 9:45 PM Performed by: Chinita Pesterriplett, Mirren Gest B, FNP Authorized  by: Chinita Pesterriplett, Shermaine Brigham B, FNP   Consent:    Consent obtained:  Verbal   Consent given by:  Patient   Risks discussed:  Infection, pain, retained foreign body, poor cosmetic result and poor wound healing Anesthesia (see MAR for exact dosages):    Anesthesia method:  Nerve block   Block location:  Digital   Block needle gauge:  25 G   Block anesthetic:  Bupivacaine 0.5% w/o epi and lidocaine 1% w/o epi   Block injection procedure:  Anatomic landmarks identified   Block outcome:  Anesthesia achieved Laceration details:    Location:  Finger   Finger location:  L thumb   Length (cm):  3 Repair type:    Repair type:  Intermediate Pre-procedure details:    Preparation:  Patient was prepped and draped in usual sterile fashion Exploration:    Hemostasis achieved with:  Direct pressure   Wound exploration: entire depth of wound probed and visualized     Contaminated: no   Treatment:    Area cleansed with:  Saline and Betadine   Amount of cleaning:  Extensive   Irrigation solution:  Sterile saline   Irrigation method:  Syringe   Visualized foreign bodies/material removed: no   Skin repair:    Repair method:  Sutures   Suture size:  4-0   Suture material:  Chromic gut   Number of sutures:  9 Approximation:    Approximation:  Close   Vermilion border: well-aligned   Post-procedure details:    Dressing:  Sterile dressing   Patient tolerance of procedure:  Tolerated well, no immediate complications Comments:     4-0 Monocryl sutures were inserted into the base of the nail to secure it due to the way the injury occurred that destroyed the cuticle.  The medial and lateral edges of the nail were tucked back into the edges of the remaining cuticle and also secured.  5-0 nylon was used to reapproximate the lacerations on the soft tissue surfaces of the lateral and medial aspects of the thumb.   ____________________________________________   INITIAL IMPRESSION / ASSESSMENT AND PLAN / ED  COURSE  Patricia Douglas is a 42 y.o. female who presents to the emergency department for evaluation, treatment, and repair of laceration of the left thumb that was sustained while at work.  Digital block provided complete anesthesia of the left thumb and the patient tolerated procedure well.  Interpreter was utilized to explain the importance of close follow-up and taking both the Bactrim and Keflex to prevent infection.  Signs and symptoms of infections were also reviewed with the patient.  Wound care instructions were given verbally as well as written.  She is to call and schedule an appointment with the hand specialist.  She is to call first thing in the morning and verbalizes understanding of this.  She was instructed to return to the emergency department for symptoms of change or worsen if she is unable to schedule an appointment.  I do not  feel that this patient should return back to work until she has been cleared by an orthopedist or hand specialist.  She was advised and agrees.  Medications - No data to display   Pertinent labs & imaging results that were available during my care of the patient were reviewed by me and considered in my medical decision making (see chart for details). ____________________________________________   FINAL CLINICAL IMPRESSION(S) / ED DIAGNOSES  Final diagnoses:  Laceration of left thumb without foreign body with damage to nail, initial encounter  Open nondisplaced fracture of distal phalanx of left thumb, initial encounter    ED Discharge Orders        Ordered    sulfamethoxazole-trimethoprim (BACTRIM DS,SEPTRA DS) 800-160 MG tablet  2 times daily     08/31/17 2040    cephALEXin (KEFLEX) 500 MG capsule  2 times daily     08/31/17 2040    oxyCODONE (ROXICODONE) 5 MG immediate release tablet  Every 8 hours PRN     08/31/17 2040       Note:  This document was prepared using Dragon voice recognition software and may include unintentional dictation  errors.    Chinita Pester, FNP 09/01/17 0036    Nita Sickle, MD 09/01/17 [phone]

## 2017-10-26 ENCOUNTER — Emergency Department: Payer: Managed Care, Other (non HMO)

## 2017-10-26 ENCOUNTER — Encounter: Payer: Self-pay | Admitting: Emergency Medicine

## 2017-10-26 ENCOUNTER — Emergency Department
Admission: EM | Admit: 2017-10-26 | Discharge: 2017-10-26 | Disposition: A | Discharge status: Home / Self Care | Payer: Managed Care, Other (non HMO) | Attending: Emergency Medicine | Admitting: Emergency Medicine

## 2017-10-26 ENCOUNTER — Other Ambulatory Visit: Payer: Self-pay

## 2017-10-26 DIAGNOSIS — R1013 Epigastric pain: Secondary | ICD-10-CM | POA: Diagnosis present

## 2017-10-26 DIAGNOSIS — Z9049 Acquired absence of other specified parts of digestive tract: Secondary | ICD-10-CM | POA: Diagnosis not present

## 2017-10-26 DIAGNOSIS — K529 Noninfective gastroenteritis and colitis, unspecified: Secondary | ICD-10-CM | POA: Insufficient documentation

## 2017-10-26 LAB — URINALYSIS, COMPLETE (UACMP) WITH MICROSCOPIC
BACTERIA UA: NONE SEEN
BILIRUBIN URINE: NEGATIVE
GLUCOSE, UA: NEGATIVE mg/dL
Ketones, ur: NEGATIVE mg/dL
LEUKOCYTES UA: NEGATIVE
NITRITE: NEGATIVE
PROTEIN: NEGATIVE mg/dL
Specific Gravity, Urine: 1.011 (ref 1.005–1.030)
pH: 6 (ref 5.0–8.0)

## 2017-10-26 LAB — COMPREHENSIVE METABOLIC PANEL
ALBUMIN: 3.8 g/dL (ref 3.5–5.0)
ALT: 24 U/L (ref 14–54)
AST: 25 U/L (ref 15–41)
Alkaline Phosphatase: 79 U/L (ref 38–126)
Anion gap: 6 (ref 5–15)
BILIRUBIN TOTAL: 0.4 mg/dL (ref 0.3–1.2)
BUN: 6 mg/dL (ref 6–20)
CO2: 25 mmol/L (ref 22–32)
Calcium: 8.9 mg/dL (ref 8.9–10.3)
Chloride: 108 mmol/L (ref 101–111)
Creatinine, Ser: 0.67 mg/dL (ref 0.44–1.00)
GFR calc Af Amer: >60 mL/min (ref >60)
GFR calc non Af Amer: >60 mL/min (ref >60)
GLUCOSE: 99 mg/dL (ref 65–99)
POTASSIUM: 3.7 mmol/L (ref 3.5–5.1)
Sodium: 139 mmol/L (ref 135–145)
TOTAL PROTEIN: 7.2 g/dL (ref 6.5–8.1)

## 2017-10-26 LAB — CBC
HEMATOCRIT: 37.9 % (ref 35.0–47.0)
Hemoglobin: 12.1 g/dL (ref 12.0–16.0)
MCH: 25.3 pg — ABNORMAL LOW (ref 26.0–34.0)
MCHC: 32 g/dL (ref 32.0–36.0)
MCV: 79.1 fL — ABNORMAL LOW (ref 80.0–100.0)
Platelets: 204 10*3/uL (ref 150–440)
RBC: 4.79 MIL/uL (ref 3.80–5.20)
RDW: 14.8 % — ABNORMAL HIGH (ref 11.5–14.5)
WBC: 4.9 10*3/uL (ref 3.6–11.0)

## 2017-10-26 LAB — POCT PREGNANCY, URINE: PREG TEST UR: NEGATIVE

## 2017-10-26 LAB — INFLUENZA PANEL BY PCR (TYPE A & B)
Influenza A By PCR: NEGATIVE
Influenza B By PCR: NEGATIVE

## 2017-10-26 LAB — LIPASE, BLOOD: Lipase: 30 U/L (ref 11–51)

## 2017-10-26 MED ORDER — ACETAMINOPHEN 500 MG PO TABS
1000.0000 mg | ORAL_TABLET | Freq: Once | ORAL | Status: AC
  Administered 2017-10-26: 1000 mg via ORAL

## 2017-10-26 MED ORDER — CIPROFLOXACIN HCL 500 MG PO TABS: 500.0000 mg | ORAL_TABLET | Freq: Two times a day (BID) | ORAL | 0 refills | Status: AC

## 2017-10-26 MED ORDER — ONDANSETRON 4 MG PO TBDP: 4.0000 mg | ORAL_TABLET | Freq: Three times a day (TID) | ORAL | 0 refills | Status: AC | PRN

## 2017-10-26 MED ORDER — ACETAMINOPHEN 500 MG PO TABS
ORAL_TABLET | ORAL | Status: AC
  Administered 2017-10-26: 1000 mg via ORAL
  Filled 2017-10-26: qty 2

## 2017-10-26 MED ORDER — ONDANSETRON 4 MG PO TBDP: 4.0000 mg | ORAL_TABLET | Freq: Once | ORAL | Status: DC

## 2017-10-26 MED ORDER — ONDANSETRON HCL 4 MG/2ML IJ SOLN
4.0000 mg | Freq: Once | INTRAMUSCULAR | Status: AC
  Administered 2017-10-26: 4 mg via INTRAVENOUS

## 2017-10-26 MED ORDER — KETOROLAC TROMETHAMINE 10 MG PO TABS: 10.0000 mg | ORAL_TABLET | Freq: Once | ORAL | Status: DC

## 2017-10-26 MED ORDER — SODIUM CHLORIDE 0.9 % IV BOLUS
1000.0000 mL | Freq: Once | INTRAVENOUS | Status: AC
  Administered 2017-10-26: 1000 mL via INTRAVENOUS

## 2017-10-26 MED ORDER — IOPAMIDOL (ISOVUE-300) INJECTION 61%
30.0000 mL | Freq: Once | INTRAVENOUS | Status: AC | PRN
  Administered 2017-10-26: 30 mL via ORAL
  Filled 2017-10-26: qty 30

## 2017-10-26 MED ORDER — METRONIDAZOLE 500 MG PO TABS: 500.0000 mg | ORAL_TABLET | Freq: Two times a day (BID) | ORAL | 0 refills | Status: AC

## 2017-10-26 MED ORDER — IOPAMIDOL (ISOVUE-300) INJECTION 61%
100.0000 mL | Freq: Once | INTRAVENOUS | Status: AC | PRN
  Administered 2017-10-26: 100 mL via INTRAVENOUS
  Filled 2017-10-26: qty 100

## 2017-10-26 MED ORDER — ONDANSETRON HCL 4 MG/2ML IJ SOLN
INTRAMUSCULAR | Status: AC
  Administered 2017-10-26: 4 mg via INTRAVENOUS
  Filled 2017-10-26: qty 2

## 2017-10-26 NOTE — ED Triage Notes (Signed)
Pt triaged using web interpretor services [phone]#[id number].  Pt here with c/o fever on Saturday, then Sunday began having severe abdominal pain all over her stomach.  Diarrhea, no vomiting, states she is feeling pain on the right side of her head and her right eye feels weak.  Face symmetrical, no slurred speech, grips equal bilaterally.

## 2017-10-26 NOTE — ED Provider Notes (Signed)
[facility] Emergency Department Provider Note  ____________________________________________  Time seen: Approximately 4:28 PM  I have reviewed the triage vital signs and the nursing notes.   HISTORY  Chief Complaint Abdominal Pain    HPI Mckinley JewelClaudia E Rosenbach is a 42 y.o. female nonpregnant female, otherwise healthy, presenting with epigastric and right lower quadrant pain, nausea without vomiting, loose stool, and mild headache.  The patient reports that since Saturday, she has had general myalgias with body aches, loose nonbloody stool, which has been improving, nausea without vomiting.  She is also had a mild headache.  She does not have a thermometer at home but feels that she has been having fevers and shaking chills.  She denies any dysuria, urinary frequency.  She has not tried anything for her symptoms.  Past Medical History:  Diagnosis Date  . Dysuria   . Heartburn     Patient Active Problem List   Diagnosis Date Noted  . Dysuria 12/08/2015  . Flank pain 12/08/2015  . Incontinence 12/08/2015  . Lower urinary tract infection 04/18/2013    Past Surgical History:  Procedure Laterality Date  . CHOLECYSTECTOMY    . TUBAL LIGATION      Current Outpatient Rx  . Order #: [id number] Class: Print  . Order #: [id number] Class: Fax  . Order #: [id number] Class: Print  . Order #: [id number] Class: Print  . Order #: [id number] Class: Print  . Order #: [id number] Class: Print    Allergies Patient has no known allergies.  Family History  Problem Relation Age of Onset  . Kidney disease Neg Hx   . Bladder Cancer Neg Hx     Social History Social History   Tobacco Use  . Smoking status: Never Smoker  . Smokeless tobacco: Never Used  Substance Use Topics  . Alcohol use: No    Alcohol/week: 0.0 oz  . Drug use: No    Review of Systems Constitutional: Positive subjective fever and shaking chills.  Lightheadedness or syncope. Eyes: No visual changes. ENT:  No sore throat. No congestion or rhinorrhea. Cardiovascular: Denies chest pain. Denies palpitations. Respiratory: Denies shortness of breath.  No cough. Gastrointestinal: Positive epigastric and right lower quadrant abdominal pain.  Positive nausea, no vomiting.  Positive diarrhea.  No constipation. Genitourinary: Negative for dysuria.  No urinary frequency.  Vaginal discharge. Musculoskeletal: Negative for back pain. Skin: Negative for rash. Neurological: Negative for headaches. No focal numbness, tingling or weakness.     ____________________________________________   PHYSICAL EXAM:  VITAL SIGNS: ED Triage Vitals  Enc Vitals Group     BP 10/26/17 1326 (!) 127/95     Pulse Rate 10/26/17 1326 74     Resp 10/26/17 1326 16     Temp 10/26/17 1326 99.1 F (37.3 C)     Temp Source 10/26/17 1326 Oral     SpO2 10/26/17 1326 100 %     Weight 10/26/17 1334 141 lb (64 kg)     Height 10/26/17 1334 5\' 2"  (1.575 m)     Head Circumference --      Peak Flow --      Pain Score 10/26/17 1333 7     Pain Loc --      Pain Edu? --      Excl. in GC? --     Constitutional: Alert and oriented. Well appearing and in no acute distress. Answers questions appropriately.  Comfortably in the stretcher without any distress. Eyes: Conjunctivae are normal.  EOMI. No scleral icterus. Head: Atraumatic. Nose: No  congestion/rhinnorhea. Mouth/Throat: Mucous membranes are moist.  Neck: No stridor.  Supple.  No JVD.  No meningismus. Cardiovascular: Normal rate, regular rhythm. No murmurs, rubs or gallops.  Respiratory: Normal respiratory effort.  No accessory muscle use or retractions. Lungs CTAB.  No wheezes, rales or ronchi. Gastrointestinal: Soft, and nondistended.  Focal tenderness in the right lower quadrant and the epigastrium.  Negative Murphy sign.  No guarding or rebound.  No peritoneal signs. Genitourinary: Deferred as patient is asymptomatic. Musculoskeletal: No LE edema. Neurologic:  A&Ox3.  Speech  is clear.  Face and smile are symmetric.  EOMI.  Moves all extremities well. Skin:  Skin is warm, dry and intact. No rash noted. Psychiatric: Mood and affect are normal. Speech and behavior are normal.  Normal judgement.  ____________________________________________   LABS (all labs ordered are listed, but only abnormal results are displayed)  Labs Reviewed  CBC - Abnormal; Notable for the following components:      Result Value   MCV 79.1 (*)    MCH 25.3 (*)    RDW 14.8 (*)    All other components within normal limits  URINALYSIS, COMPLETE (UACMP) WITH MICROSCOPIC - Abnormal; Notable for the following components:   Color, Urine YELLOW (*)    APPearance HAZY (*)    Hgb urine dipstick LARGE (*)    Squamous Epithelial / LPF 0-5 (*)    All other components within normal limits  LIPASE, BLOOD  COMPREHENSIVE METABOLIC PANEL  INFLUENZA PANEL BY PCR (TYPE A & B)  POC URINE PREG, ED  POCT PREGNANCY, URINE   ____________________________________________  EKG  Not indicated ____________________________________________  RADIOLOGY  Ct Abdomen Pelvis W Contrast  Result Date: 10/26/2017 CLINICAL DATA:  Abdominal pain for 2 days predominately in the right lower quadrant EXAM: CT ABDOMEN AND PELVIS WITH CONTRAST TECHNIQUE: Multidetector CT imaging of the abdomen and pelvis was performed using the standard protocol following bolus administration of intravenous contrast. CONTRAST:  ISOVUE-300 IOPAMIDOL (ISOVUE-300) INJECTION 61% COMPARISON:  12/20/2015 FINDINGS: Lower chest: Lung bases are free of acute infiltrate or sizable effusion. Hepatobiliary: No focal liver abnormality is seen. Status post cholecystectomy. No biliary dilatation. Pancreas: Unremarkable. No pancreatic ductal dilatation or surrounding inflammatory changes. Spleen: Normal in size without focal abnormality. Adrenals/Urinary Tract: Adrenal glands are unremarkable. Kidneys are normal, without renal calculi, focal lesion, or  hydronephrosis. Bladder is unremarkable. Stomach/Bowel: The appendix is within normal limits. No small bowel obstructive changes are seen. The colon is predominately decompressed with some edematous changes in the ascending colon and proximal transverse colon. No definitive vascular abnormality is noted in this likely represents a mild inflammatory colitis. Vascular/Lymphatic: No significant vascular findings are present. No enlarged abdominal or pelvic lymph nodes. Reproductive: Uterus and bilateral adnexa are unremarkable. Other: No abdominal wall hernia or abnormality. No abdominopelvic ascites. Musculoskeletal: No acute or significant osseous findings. IMPRESSION: Mildly edematous decompressed colon which may represent a degree of colitis. This is likely inflammatory in nature as no particular vascular abnormality is noted. No other focal abnormality is noted. Electronically Signed   By: Alcide Clever M.D.   On: 10/26/2017 17:28    ____________________________________________   PROCEDURES  Procedure(s) performed: None  Procedures  Critical Care performed: No ____________________________________________   INITIAL IMPRESSION / ASSESSMENT AND PLAN / ED COURSE  Pertinent labs & imaging results that were available during my care of the patient were reviewed by me and considered in my medical decision making (see chart for details).  42 y.o. F, otherwise healthy  presenting w/ epigastric and RUQ pain, body aches, diarrhea, shaking chills and subjective fever, nausea without vomiting.  Here, the patient is hemodynamically stable and afebrile.  She does have focal tenderness to palpation in the right lower quadrant and although appendicitis is less likely, given her constellation of symptoms, normal white blood cell count, I am concerned about early appendicitis will get a CT scan for further evaluation.  Here, her white blood cell count is normal, her electrolytes are normal, her urinalysis does not  show any evidence of infection.  If her CT scan is within normal limits, the likely etiology of the patient's symptoms is a viral syndrome versus food borne illness and I will treat her symptomatically.  ----------------------------------------- 5:47 PM on 10/26/2017 -----------------------------------------  The patient's workup is reassuring.  Her CT scan does not show any evidence of appendicitis.  She does have some edema around the colon that is suggestive of colitis.  Here, the patient continues to be hemodynamically stable, afebrile, and able to tolerate liquids without difficulty.  I will plan to discharge her home with a 7-day course of ciprofloxacin and Flagyl, and have her follow-up with her primary care physician.  She understands follow-up instructions as well as return precautions ____________________________________________  FINAL CLINICAL IMPRESSION(S) / ED DIAGNOSES  Final diagnoses:  Colitis         NEW MEDICATIONS STARTED DURING THIS VISIT:  New Prescriptions   CIPROFLOXACIN (CIPRO) 500 MG TABLET    Take 1 tablet (500 mg total) by mouth 2 (two) times daily for 7 days.   METRONIDAZOLE (FLAGYL) 500 MG TABLET    Take 1 tablet (500 mg total) by mouth 2 (two) times daily for 7 days.   ONDANSETRON (ZOFRAN ODT) 4 MG DISINTEGRATING TABLET    Take 1 tablet (4 mg total) by mouth every 8 (eight) hours as needed for nausea or vomiting.      Rockne Menghini, MD 10/26/17 1747

## 2017-10-26 NOTE — ED Notes (Signed)
Pt c/o right sided HA that radiates into the back of the head, pt also c/o generalized abd cramping with diarrhea since yesterday, states pt is worse in the RLQ.

## 2017-10-26 NOTE — ED Notes (Signed)
Interp requested @1600 

## 2017-10-26 NOTE — Discharge Instructions (Signed)
Please take the entire course of antibiotics, even if you are feeling better.  Zofran is for nausea and vomiting.  Please avoid taking any antidiarrheal medications for your symptoms.  Return to the emergency department if you develop severe pain, lightheadedness or fainting, inability to keep down fluids, or any other symptoms concerning to you.

## 2020-02-11 ENCOUNTER — Emergency Department: Payer: PRIVATE HEALTH INSURANCE

## 2020-02-11 ENCOUNTER — Other Ambulatory Visit: Payer: Self-pay

## 2020-02-11 ENCOUNTER — Emergency Department
Admission: EM | Admit: 2020-02-11 | Discharge: 2020-02-11 | Disposition: A | Discharge status: Home / Self Care | Payer: PRIVATE HEALTH INSURANCE | Attending: Emergency Medicine | Admitting: Emergency Medicine

## 2020-02-11 DIAGNOSIS — R1032 Left lower quadrant pain: Secondary | ICD-10-CM | POA: Insufficient documentation

## 2020-02-11 DIAGNOSIS — Z79899 Other long term (current) drug therapy: Secondary | ICD-10-CM | POA: Insufficient documentation

## 2020-02-11 DIAGNOSIS — N946 Dysmenorrhea, unspecified: Secondary | ICD-10-CM | POA: Insufficient documentation

## 2020-02-11 DIAGNOSIS — D649 Anemia, unspecified: Secondary | ICD-10-CM | POA: Diagnosis not present

## 2020-02-11 DIAGNOSIS — N939 Abnormal uterine and vaginal bleeding, unspecified: Secondary | ICD-10-CM

## 2020-02-11 LAB — URINALYSIS, COMPLETE (UACMP) WITH MICROSCOPIC
Bacteria, UA: NONE SEEN
Bilirubin Urine: NEGATIVE
Glucose, UA: NEGATIVE mg/dL
Ketones, ur: NEGATIVE mg/dL
Leukocytes,Ua: NEGATIVE
Nitrite: NEGATIVE
Protein, ur: 30 mg/dL — AB
RBC / HPF: >50 RBC/hpf — ABNORMAL HIGH (ref 0–5)
Specific Gravity, Urine: 1.025 (ref 1.005–1.030)
pH: 5 (ref 5.0–8.0)

## 2020-02-11 LAB — COMPREHENSIVE METABOLIC PANEL
ALT: 18 U/L (ref 0–44)
AST: 19 U/L (ref 15–41)
Albumin: 3.9 g/dL (ref 3.5–5.0)
Alkaline Phosphatase: 79 U/L (ref 38–126)
Anion gap: 6 (ref 5–15)
BUN: 9 mg/dL (ref 6–20)
CO2: 23 mmol/L (ref 22–32)
Calcium: 8.7 mg/dL — ABNORMAL LOW (ref 8.9–10.3)
Chloride: 108 mmol/L (ref 98–111)
Creatinine, Ser: 0.73 mg/dL (ref 0.44–1.00)
GFR calc Af Amer: >60 mL/min (ref >60)
GFR calc non Af Amer: >60 mL/min (ref >60)
Glucose, Bld: 103 mg/dL — ABNORMAL HIGH (ref 70–99)
Potassium: 4.1 mmol/L (ref 3.5–5.1)
Sodium: 137 mmol/L (ref 135–145)
Total Bilirubin: 0.7 mg/dL (ref 0.3–1.2)
Total Protein: 7.2 g/dL (ref 6.5–8.1)

## 2020-02-11 LAB — CBC
HCT: 36.3 % (ref 36.0–46.0)
Hemoglobin: 11.2 g/dL — ABNORMAL LOW (ref 12.0–15.0)
MCH: 23.8 pg — ABNORMAL LOW (ref 26.0–34.0)
MCHC: 30.9 g/dL (ref 30.0–36.0)
MCV: 77.1 fL — ABNORMAL LOW (ref 80.0–100.0)
Platelets: 289 10*3/uL (ref 150–400)
RBC: 4.71 MIL/uL (ref 3.87–5.11)
RDW: 15.1 % (ref 11.5–15.5)
WBC: 8.3 10*3/uL (ref 4.0–10.5)
nRBC: 0 % (ref 0.0–0.2)

## 2020-02-11 LAB — LIPASE, BLOOD: Lipase: 31 U/L (ref 11–51)

## 2020-02-11 LAB — POCT PREGNANCY, URINE: Preg Test, Ur: NEGATIVE

## 2020-02-11 MED ORDER — MELOXICAM 15 MG PO TABS: 15.0000 mg | ORAL_TABLET | Freq: Every day | ORAL | 0 refills | Status: AC

## 2020-02-11 MED ORDER — ACETAMINOPHEN 325 MG PO TABS
650.0000 mg | ORAL_TABLET | Freq: Once | ORAL | Status: AC
  Administered 2020-02-11: 14:00:00 650 mg via ORAL
  Filled 2020-02-11: qty 2

## 2020-02-11 MED ORDER — KETOROLAC TROMETHAMINE 30 MG/ML IJ SOLN
30.0000 mg | Freq: Once | INTRAMUSCULAR | Status: AC
  Administered 2020-02-11: 14:00:00 30 mg via INTRAMUSCULAR
  Filled 2020-02-11: qty 1

## 2020-02-11 NOTE — ED Notes (Signed)
Interpreter paged.

## 2020-02-11 NOTE — ED Provider Notes (Signed)
[facility] Emergency Department Provider Note  ____________________________________________  Time seen: Approximately 1:52 PM  I have reviewed the triage vital signs and the nursing notes.   HISTORY  Chief Complaint Abdominal Pain  Interpreter was used   HPI Patricia Douglas is a 44 y.o. female who presents the emergency department complaining of sudden left lower quadrant abdominal pain.  Patient states that this began this morning.  At the pain she has been nauseated and had episode of emesis.  No diarrhea, constipation.  No dysuria, polyuria, reported hematuria.  No vaginal bleeding or discharge.  No history of same.  No history of chronic abdominal problems.  Patient is not taking medications for his complaint prior to arrival.    Patient further quantifies with attending provider, Dr. Fuller Plan that she is having vaginal bleeding x3 to 4 days.  She has had irregular periods after receiving her Covid vaccine.  She states that the bleeding is heavier than normal.  No concern for STD.        Past Medical History:  Diagnosis Date  . Dysuria   . Heartburn     Patient Active Problem List   Diagnosis Date Noted  . Dysuria 12/08/2015  . Flank pain 12/08/2015  . Incontinence 12/08/2015  . Lower urinary tract infection 04/18/2013    Past Surgical History:  Procedure Laterality Date  . CHOLECYSTECTOMY    . TUBAL LIGATION      Prior to Admission medications   Medication Sig Start Date End Date Taking? Authorizing Provider  meloxicam (MOBIC) 15 MG tablet Take 1 tablet (15 mg total) by mouth daily. 02/11/20   Briseidy Spark, Delorise Royals, PA-C  Meth-Hyo-M Bl-Na Phos-Ph Sal (URIBEL) 118 MG CAPS Take 1 capsule (118 mg total) by mouth 4 (four) times daily as needed. 01/31/16   Vanna Scotland, MD  ondansetron (ZOFRAN ODT) 4 MG disintegrating tablet Take 1 tablet (4 mg total) by mouth every 8 (eight) hours as needed for nausea or vomiting. 10/26/17   Rockne Menghini,  MD  sulfamethoxazole-trimethoprim (BACTRIM DS,SEPTRA DS) 800-160 MG tablet Take 1 tablet by mouth 2 (two) times daily. 08/31/17   Chinita Pester, FNP    Allergies Patient has no known allergies.  Family History  Problem Relation Age of Onset  . Kidney disease Neg Hx   . Bladder Cancer Neg Hx     Social History Social History   Tobacco Use  . Smoking status: Never Smoker  . Smokeless tobacco: Never Used  Substance Use Topics  . Alcohol use: No    Alcohol/week: 0.0 standard drinks  . Drug use: No     Review of Systems  Constitutional: No fever/chills Eyes: No visual changes. No discharge ENT: No upper respiratory complaints. Cardiovascular: no chest pain. Respiratory: no cough. No SOB. Gastrointestinal: Sharp left lower quadrant abdominal pain.  No nausea, no vomiting.  No diarrhea.  No constipation. Genitourinary: Negative for dysuria. No hematuria.  No vaginal bleeding or discharge. Musculoskeletal: Negative for musculoskeletal pain. Skin: Negative for rash, abrasions, lacerations, ecchymosis. Neurological: Negative for headaches, focal weakness or numbness. 10-point ROS otherwise negative.  ____________________________________________   PHYSICAL EXAM:  VITAL SIGNS: ED Triage Vitals  Enc Vitals Group     BP 02/11/20 1032 129/75     Pulse Rate 02/11/20 1032 63     Resp 02/11/20 1032 16     Temp 02/11/20 1032 98.3 F (36.8 C)     Temp Source 02/11/20 1032 Oral     SpO2 02/11/20  1032 99 %     Weight 02/11/20 1038 145 lb (65.8 kg)     Height 02/11/20 1038 5\' 2"  (1.575 m)     Head Circumference --      Peak Flow --      Pain Score 02/11/20 1037 3     Pain Loc --      Pain Edu? --      Excl. in GC? --      Constitutional: Alert and oriented. Well appearing and in no acute distress. Eyes: Conjunctivae are normal. PERRL. EOMI. Head: Atraumatic. ENT:      Ears:       Nose: No congestion/rhinnorhea.      Mouth/Throat: Mucous membranes are moist.  Neck: No  stridor.    Cardiovascular: Normal rate, regular rhythm. Normal S1 and S2.  Good peripheral circulation. Respiratory: Normal respiratory effort without tachypnea or retractions. Lungs CTAB. Good air entry to the bases with no decreased or absent breath sounds. Gastrointestinal: No visible external abdominal wall findings.  Bowel sounds 4 quadrants.  Soft to palpation all quadrants.  Patient is tender to palpation of left lower quadrant with mild guarding.  No rigidity.  No other guarding.  No palpable masses. No distention. No CVA tenderness. Genitourinary: Patient declines pelvic Musculoskeletal: Full range of motion to all extremities. No gross deformities appreciated. Neurologic:  Normal speech and language. No gross focal neurologic deficits are appreciated.  Skin:  Skin is warm, dry and intact. No rash noted. Psychiatric: Mood and affect are normal. Speech and behavior are normal. Patient exhibits appropriate insight and judgement.   ____________________________________________   LABS (all labs ordered are listed, but only abnormal results are displayed)  Labs Reviewed  COMPREHENSIVE METABOLIC PANEL - Abnormal; Notable for the following components:      Result Value   Glucose, Bld 103 (*)    Calcium 8.7 (*)    All other components within normal limits  CBC - Abnormal; Notable for the following components:   Hemoglobin 11.2 (*)    MCV 77.1 (*)    MCH 23.8 (*)    All other components within normal limits  URINALYSIS, COMPLETE (UACMP) WITH MICROSCOPIC - Abnormal; Notable for the following components:   Color, Urine YELLOW (*)    APPearance HAZY (*)    Hgb urine dipstick LARGE (*)    Protein, ur 30 (*)    RBC / HPF >50 (*)    All other components within normal limits  LIPASE, BLOOD  POC URINE PREG, ED  POCT PREGNANCY, URINE   ____________________________________________  EKG   ____________________________________________  RADIOLOGY I personally viewed and evaluated  these images as part of my medical decision making, as well as reviewing the written report by the radiologist.  02/13/20 PELVIS (TRANSABDOMINAL ONLY)  Result Date: 02/11/2020 CLINICAL DATA:  Vaginal bleeding and left lower quadrant pain EXAM: TRANSABDOMINAL ULTRASOUND OF PELVIS TECHNIQUE: Transabdominal ultrasound examination of the pelvis was performed including evaluation of the uterus, ovaries, adnexal regions, and pelvic cul-de-sac. COMPARISON:  None. FINDINGS: Uterus Measurements: 10.3 x 4.5 x 4.8 cm = volume: 117 mL. No fibroids or other mass visualized. Endometrium Thickness: 6.7 mm.  No focal abnormality visualized. Right ovary Measurements: 1.7 x 1.5 x 2.7 cm = volume: 4 mL. Normal appearance/no adnexal mass. Left ovary Measurements: 3.4 x 1.1 x 2.9 cm = volume: 5 mL. Normal appearance/no adnexal mass. Other findings:  No abnormal free fluid. IMPRESSION: Normal examination Electronically Signed   By: 02/13/2020.D.  On: 02/11/2020 15:12   US PELVIC DOPPLER LIMITED  Result Date: 02/11/2020 CLINICAL DATA:  Vaginal bleeding and left lower quadrant pain EXAM: TRANSABDOMINAL ULTRASOUND OF PELVIS TECHNIQUE: Transabdominal ultrasound examination of the pelvis was performed including evaluation of the uterus, ovaries, adnexal regions, and pelvic cul-de-sac. COMPARISON:  None. FINDINGS: Uterus Measurements: 10.3 x 4.5 x 4.8 cm = volume: 117 mL. No fibroids or other mass visualized. Endometrium Thickness: 6.7 mm.  No focal abnormality visualized. Right ovary Measurements: 1.7 x 1.5 x 2.7 cm = volume: 4 mL. Normal appearance/no adnexal mass. Left ovary Measurements: 3.4 x 1.1 x 2.9 cm = volume: 5 mL. Normal appearance/no adnexal mass. Other findings:  No abnormal free fluid. IMPRESSION: Normal examination Electronically Signed   By: Jonna Clark M.D.   On: 02/11/2020 15:12   CT Renal Stone Study  Result Date: 02/11/2020 CLINICAL DATA:  Near-syncope earlier today. Left lower quadrant and left back pain.  Hematuria. EXAM: CT ABDOMEN AND PELVIS WITHOUT CONTRAST TECHNIQUE: Multidetector CT imaging of the abdomen and pelvis was performed following the standard protocol without IV contrast. COMPARISON:  CT abdomen and pelvis 10/26/2017. FINDINGS: Lower chest: Lung bases clear.  No pleural or pericardial effusion. Hepatobiliary: No focal liver abnormality is seen. No gallstones, gallbladder wall thickening, or biliary dilatation. Pancreas: Unremarkable. No pancreatic ductal dilatation or surrounding inflammatory changes. Spleen: Normal in size without focal abnormality. Adrenals/Urinary Tract: Small, benign left adrenal adenoma is unchanged. The right adrenal gland appears normal. Punctate nonobstructing stone is seen in the upper pole of the left kidney. No other urinary tract stones. No hydronephrosis or focal renal lesion. Ureters and urinary bladder appear normal. Stomach/Bowel: Stomach is within normal limits. Appendix appears normal. No evidence of bowel wall thickening, distention, or inflammatory changes. Vascular/Lymphatic: No significant vascular findings are present. No enlarged abdominal or pelvic lymph nodes. Reproductive: Uterus and bilateral adnexa are unremarkable. Other: None. Musculoskeletal: Negative. IMPRESSION: No acute abnormality. No finding to explain the patient's symptoms. Punctate nonobstructing stone upper pole right kidney. Electronically Signed   By: Drusilla Kanner M.D.   On: 02/11/2020 13:32    ____________________________________________    PROCEDURES  Procedure(s) performed:    Procedures    Medications  acetaminophen (TYLENOL) tablet 650 mg (650 mg Oral Given 02/11/20 1405)  ketorolac (TORADOL) 30 MG/ML injection 30 mg (30 mg Intramuscular Given 02/11/20 1406)     ____________________________________________   INITIAL IMPRESSION / ASSESSMENT AND PLAN / ED COURSE  Pertinent labs & imaging results that were available during my care of the patient were reviewed by  me and considered in my medical decision making (see chart for details).  Review of the [place] CSRS was performed in accordance of the [facility] prior to dispensing any controlled drugs.           Patient's diagnosis is consistent with dysmenorrhea.  Patient presented to emergency department complaining of left lower quadrant abdominal pain.  Initially patient denied any other complaints.  No dysuria, polyuria, hematuria, vaginal bleeding or discharge.  However patient ultimately did endorse irregular periods with a menstrual cycle had been heavier than normal x4 days.  Overall work-up is reassuring.  No evidence of kidney stone.  No evidence of ovarian torsion or cyst.  Given symptoms, I do feel that this is likely worse than normal.  Cramps.  Patient will be prescribed anti-inflammatory, use Tylenol at home.  Heating pad and plenty of fluids.  Follow-up with OB/GYN/primary care as needed..  Return precautions discussed with the patient.  ____________________________________________  FINAL CLINICAL IMPRESSION(S) / ED DIAGNOSES  Final diagnoses:  Vaginal bleeding  LLQ pain  Severe menstrual cramps      NEW MEDICATIONS STARTED DURING THIS VISIT:  ED Discharge Orders         Ordered    meloxicam (MOBIC) 15 MG tablet  Daily     Discontinue  Reprint     02/11/20 1622              This chart was dictated using voice recognition software/Dragon. Despite best efforts to proofread, errors can occur which can change the meaning. Any change was purely unintentional.    Racheal PatchesCuthriell, Jersie Beel D, PA-C 02/11/20 1623    Concha SeFunke, Mary E, MD 02/12/20 1247

## 2020-02-11 NOTE — ED Triage Notes (Signed)
Almost passed out with pain a couple hours ago and vomited, pt is pointing to llq that radiates around to the left side of her back. Denies pain going to the bathroom. Has never felt this way before

## 2020-02-11 NOTE — ED Notes (Signed)
Pt taken for U/S

## 2020-02-11 NOTE — ED Notes (Signed)
Pt taken to CT.

## 2021-07-15 ENCOUNTER — Other Ambulatory Visit: Payer: Self-pay | Admitting: Family Medicine

## 2021-07-15 DIAGNOSIS — Z1231 Encounter for screening mammogram for malignant neoplasm of breast: Secondary | ICD-10-CM

## 2022-12-04 ENCOUNTER — Other Ambulatory Visit: Payer: Self-pay | Admitting: Family Medicine

## 2022-12-04 DIAGNOSIS — Z1231 Encounter for screening mammogram for malignant neoplasm of breast: Secondary | ICD-10-CM

## 2023-08-25 ENCOUNTER — Other Ambulatory Visit: Payer: Self-pay | Admitting: Family Medicine

## 2023-08-25 DIAGNOSIS — Z1231 Encounter for screening mammogram for malignant neoplasm of breast: Secondary | ICD-10-CM

## 2024-08-01 ENCOUNTER — Other Ambulatory Visit: Payer: Self-pay | Admitting: Family Medicine

## 2024-08-01 DIAGNOSIS — Z1231 Encounter for screening mammogram for malignant neoplasm of breast: Secondary | ICD-10-CM

## 2024-08-30 ENCOUNTER — Ambulatory Visit
Admission: RE | Admit: 2024-08-30 | Discharge: 2024-08-30 | Disposition: A | Payer: PRIVATE HEALTH INSURANCE | Source: Ambulatory Visit | Attending: Family Medicine | Admitting: Family Medicine

## 2024-08-30 DIAGNOSIS — Z1231 Encounter for screening mammogram for malignant neoplasm of breast: Secondary | ICD-10-CM
